# Patient Record
Sex: Female | Born: 1985 | Race: White | Hispanic: No | Marital: Single | Smoking: Current every day smoker
Health system: Southern US, Community
[De-identification: ages and names within clinical notes are randomized; demographics above are authoritative.]

---
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
## 2007-04-25 ENCOUNTER — Emergency Department (HOSPITAL_COMMUNITY): Admission: EM | Admit: 2007-04-25 | Discharge: 2007-04-25 | Payer: Self-pay | Admitting: Emergency Medicine

## 2007-11-22 ENCOUNTER — Other Ambulatory Visit: Admission: RE | Admit: 2007-11-22 | Discharge: 2007-11-22 | Payer: Self-pay | Admitting: Family Medicine

## 2007-11-22 ENCOUNTER — Encounter: Admission: RE | Admit: 2007-11-22 | Discharge: 2007-11-22 | Payer: Self-pay | Admitting: Emergency Medicine

## 2013-11-09 ENCOUNTER — Encounter (HOSPITAL_COMMUNITY): Payer: Self-pay | Admitting: Emergency Medicine

## 2013-11-09 ENCOUNTER — Emergency Department (INDEPENDENT_AMBULATORY_CARE_PROVIDER_SITE_OTHER)
Admission: EM | Admit: 2013-11-09 | Discharge: 2013-11-09 | Disposition: A | Discharge status: Home / Self Care | Payer: Commercial Managed Care - PPO | Source: Home / Self Care | Attending: Emergency Medicine | Admitting: Emergency Medicine

## 2013-11-09 ENCOUNTER — Emergency Department (INDEPENDENT_AMBULATORY_CARE_PROVIDER_SITE_OTHER): Payer: Commercial Managed Care - PPO

## 2013-11-09 DIAGNOSIS — J209 Acute bronchitis, unspecified: Secondary | ICD-10-CM

## 2013-11-09 MED ORDER — PREDNISONE 20 MG PO TABS: 20.0000 mg | ORAL_TABLET | Freq: Two times a day (BID) | ORAL | Status: AC

## 2013-11-09 MED ORDER — METHYLPREDNISOLONE ACETATE 80 MG/ML IJ SUSP
INTRAMUSCULAR | Status: AC
  Filled 2013-11-09: qty 1

## 2013-11-09 MED ORDER — AZITHROMYCIN 250 MG PO TABS: ORAL_TABLET | ORAL | Status: AC

## 2013-11-09 MED ORDER — HYDROCOD POLST-CHLORPHEN POLST 10-8 MG/5ML PO LQCR: 5.0000 mL | Freq: Two times a day (BID) | ORAL | Status: AC | PRN

## 2013-11-09 MED ORDER — IPRATROPIUM-ALBUTEROL 0.5-2.5 (3) MG/3ML IN SOLN
3.0000 mL | RESPIRATORY_TRACT | Status: DC
  Administered 2013-11-09: 3 mL via RESPIRATORY_TRACT

## 2013-11-09 MED ORDER — METHYLPREDNISOLONE ACETATE 80 MG/ML IJ SUSP
80.0000 mg | Freq: Once | INTRAMUSCULAR | Status: AC
  Administered 2013-11-09: 80 mg via INTRAMUSCULAR

## 2013-11-09 MED ORDER — IPRATROPIUM-ALBUTEROL 0.5-2.5 (3) MG/3ML IN SOLN
RESPIRATORY_TRACT | Status: AC
  Filled 2013-11-09: qty 3

## 2013-11-09 MED ORDER — ALBUTEROL SULFATE HFA 108 (90 BASE) MCG/ACT IN AERS: 1.0000 | INHALATION_SPRAY | Freq: Four times a day (QID) | RESPIRATORY_TRACT | Status: AC | PRN

## 2013-11-09 NOTE — ED Provider Notes (Signed)
Chief Complaint   Chief Complaint  Patient presents with  . Cough    History of Present Illness   Donna Rubio is a 28 year old female who's had a two-day history of nasal congestion with yellow drainage, headache, sinus pressure, cough productive yellow sputum, wheezing, chest tightness, chest pain, subjective fever, chills, sweats, aches, and sore throat. She's had no known sick exposures. She smokes a half a pack of cigarettes per day.  Review of Systems   Other than as noted above, the patient denies any of the following symptoms: Systemic:  No fevers, chills, sweats, or myalgias. Eye:  No redness or discharge. ENT:  No ear pain, headache, nasal congestion, drainage, sinus pressure, or sore throat. Neck:  No neck pain, stiffness, or swollen glands. Lungs:  No cough, sputum production, hemoptysis, wheezing, chest tightness, shortness of breath or chest pain. GI:  No abdominal pain, nausea, vomiting or diarrhea.  PMFSH   Past medical history, family history, social history, meds, and allergies were reviewed.   Physical exam   Vital signs:  BP 137/78  Pulse 68  Temp(Src) 97.7 F (36.5 C) (Oral)  Resp 18  SpO2 98%  LMP 11/06/2013 General:  Alert and oriented.  In no distress.  Skin warm and dry. Eye:  No conjunctival injection or drainage. Lids were normal. ENT:  TMs and canals were normal, without erythema or inflammation.  Nasal mucosa was clear and uncongested, without drainage.  Mucous membranes were moist.  Pharynx was clear with no exudate or drainage.  There were no oral ulcerations or lesions. Neck:  Supple, no adenopathy, tenderness or mass. Lungs:  No respiratory distress.  She has bilateral expiratory wheezes, no rales or rhonchi, good air movement.  Heart:  Regular rhythm, without gallops, murmers or rubs. Skin:  Clear, warm, and dry, without rash or lesions.  Radiology   Dg Chest 2 View  11/09/2013   CLINICAL DATA:  Fever chest pain and cough. ,  history of tobacco use and bronchitis  EXAM: CHEST  2 VIEW  COMPARISON:  DG CHEST 2 VIEW dated 11/22/2007  FINDINGS: The lungs are mildly hyperinflated. There is no focal infiltrate. The cardiopericardial silhouette is normal in size. The pulmonary vascularity is not engorged. The mediastinum is normal in width. There is no pleural effusion. The observed portions of the bony thorax appear normal.  IMPRESSION: There is mild hyperinflation which may reflect underlying COPD or reactive airway disease. There is no focal pneumonia. One cannot exclude acute bronchitis in the appropriate clinical setting.   Electronically Signed   By: Audiel Scheiber  [place]   On: 11/09/2013 13:28   [facility]   She was given Depo-Medrol 80 mg IM a DuoNeb breathing treatment. Thereafter her lungs were clear and wheeze free. She did feel better.  Assessment     The encounter diagnosis was Acute bronchitis.  She was strongly encouraged to quit smoking.  Plan    1.  Meds:  The following meds were prescribed:   Discharge Medication List as of 11/09/2013  1:52 PM    START taking these medications   Details  albuterol (PROVENTIL HFA;VENTOLIN HFA) 108 (90 BASE) MCG/ACT inhaler Inhale 1-2 puffs into the lungs every 6 (six) hours as needed for wheezing or shortness of breath., Starting 11/09/2013, Until Discontinued, Normal    azithromycin (ZITHROMAX Z-PAK) 250 MG tablet Take as directed., Normal    chlorpheniramine-HYDROcodone (TUSSIONEX) 10-8 MG/5ML LQCR Take 5 mLs by mouth every 12 (twelve) hours as needed  for cough., Starting 11/09/2013, Until Discontinued, Normal    predniSONE (DELTASONE) 20 MG tablet Take 1 tablet (20 mg total) by mouth 2 (two) times daily., Starting 11/09/2013, Until Discontinued, Normal        2.  Patient Education/Counseling:  The patient was given appropriate handouts, self care instructions, and instructed in symptomatic relief.  Instructed to get extra fluids, rest, and use a cool mist  vaporizer.    3.  Follow up:  The patient was told to follow up here if no better in 3 to 4 days, or sooner if becoming worse in any way, and given some red flag symptoms such as increasing fever, difficulty breathing, chest pain, or persistent vomiting which would prompt immediate return.  Follow up here as needed.      Reuben Likesavid C Amiel Sharrow, MD 11/09/13 2135

## 2013-11-09 NOTE — ED Notes (Signed)
Pt  Reports    Cough  /  congested  With       Wheezing          And  Tightness          Body  Aches  That  Started         Yesterday

## 2013-11-09 NOTE — Discharge Instructions (Signed)
Most upper respiratory infections are caused by viruses and do not require antibiotics.  We try to save the antibiotics for when we really need them to prevent bacteria from developing resistance to them.  Here are a few hints about things that can be done at home to help get over an upper respiratory infection quicker:  Get extra sleep and extra fluids.  Get 7 to 9 hours of sleep per night and 6 to 8 glasses of water a day.  Getting extra sleep keeps the immune system from getting run down.  Most people with an upper respiratory infection are a little dehydrated.  The extra fluids also keep the secretions liquified and easier to deal with.  Also, get extra vitamin C.  4000 mg per day is the recommended dose. For the aches, headache, and fever, acetaminophen or ibuprofen are helpful.  These can be alternated every 4 hours.  People with liver disease should avoid large amounts of acetaminophen, and people with ulcer disease, gastroesophageal reflux, gastritis, congestive heart failure, chronic kidney disease, coronary artery disease and the elderly should avoid ibuprofen. For nasal congestion try Mucinex-D, or if you're having lots of sneezing or clear nasal drainage use Zyrtec-D. People with high blood pressure can take these if their blood pressure is controlled, if not, it's best to avoid the forms with a "D" (decongestants).  You can use the plain Mucinex, Allegra, Claritin, or Zyrtec even if your blood pressure is not controlled.   A Saline nasal spray such as Ocean Spray can also help.  You can add a decongestant sprays such as Afrin, but you should not use the decongestant sprays for more than 3 or 4 days since they can be habituating.  Breathe Rite nasal strips can also offer a non-drug alternative treatment to nasal congestion, especially at night. For people with symptoms of sinusitis, sleeping with your head elevated can be helpful.  For sinus pain, moist, hot compresses to the face may provide some  relief.  Many people find that inhaling steam as in a shower or from a pot of steaming water can help. For any viral infection, zinc containing lozenges such as Cold-Eze or Zicam are helpful.  Zinc helps to fight viral infection.  Hot salt water gargles (8 oz of hot water, 1/2 tsp of table salt, and a pinch of baking soda) can give relief as well as hot beverages such as hot tea.  Sucrets extra strength lozenges will help the sore throat.  For the cough, take Delsym 2 tsp every 12 hours.  It has also been found recently that Aleve can help control a cough.  The dose is 1 to 2 tablets twice daily with food.  This can be combined with Delsym. (Note, if you are taking ibuprofen, you should not take Aleve as well--take one or the other.) A cool mist vaporizer will help keep your mucous membranes from drying out.   It's important when you have an upper respiratory infection not to pass the infection to others.  This involves being very careful about the following:  Frequent hand washing or use of hand sanitizer, especially after coughing, sneezing, blowing your nose or touching your face, nose or eyes. Do not shake hands or touch anyone and try to avoid touching surfaces that other people use such as doorknobs, shopping carts, telephones and computer keyboards. Use tissues and dispose of them properly in a garbage can or ziplock bag. Cough into your sleeve. Do not let others eat or  drink after you.  It's also important to recognize the signs of serious illness and get evaluated if they occur: Any respiratory infection that lasts more than 7 to 10 days.  Yellow nasal drainage and sputum are not reliable indicators of a bacterial infection, but if they last for more than 1 week, see your doctor. Fever and sore throat can indicate strep. Fever and cough can indicate influenza or pneumonia. Any kind of severe symptom such as difficulty breathing, intractable vomiting, or severe pain should prompt you to see  a doctor as soon as possible.   Your body's immune system is really the thing that will get rid of this infection.  Your immune system is comprised of 2 types of specialized cells called T cells and B cells.  T cells coordinate the array of cells in your body that engulf invading bacteria or viruses while B cells orchestrate the production of antibodies that neutralize infection.  Anything we do or any medications we give you, will just strengthen your immune system or help it clear up the infection quicker.  Here are a few helpful hints to improve your immune system to help overcome this illness or to prevent future infections:  A few vitamins can improve the health of your immune system.  That's why your diet should include plenty of fruits, vegetables, fish, nuts, and whole grains.  Vitamin A and bet-carotene can increase the cells that fight infections (T cells and B cells).  Vitamin A is abundant in dark greens and orange vegetables such as spinach, greens, sweet potatoes, and carrots.  Vitamin B6 contributes to the maturation of white blood cells, the cells that fight disease.  Foods with vitamin B6 include cold cereal and bananas.  Vitamin C is credited with preventing colds because it increases white blood cells and also prevents cellular damage.  Citrus fruits, peaches and green and red bell peppers are all hight in vitamin C.  Vitamin E is an anti-oxidant that encourages the production of natural killer cells which reject foreign invaders and B cells that produce antibodies.  Foods high in vitamin E include wheat germ, nuts and seeds.  Foods high in omega-3 fatty acids found in foods like salmon, tuna and mackerel boost your immune system and help cells to engulf and absorb germs.  Probiotics are good bacteria that increase your T cells.  These can be found in yogurt and are available in supplements such as Culturelle or Align.  Moderate exercise increases the strength of your immune  system and your ability to recover from illness.  I suggest 3 to 5 moderate intensity 30 minute workouts per week.    Sleep is another component of maintaining a strong immune system.  It enables your body to recuperate from the day's activities, stress and work.  My recommendation is to get between 7 and 9 hours of sleep per night.  If you smoke, try to quit completely or at least cut down.  Drink alcohol only in moderation if at all.  No more than 2 drinks daily for men or 1 for women.  Get a flu vaccine early in the fall or if you have not gotten one yet, once this illness has run its course.  If you are over 65, a smoker, or an asthmatic, get a pneumococcal vaccine.  My final recommendation is to maintain a healthy weight.  Excess weight can impair the immune system by interfering with the way the immune system deals with invading viruses or  bacteria.   Bronchitis Bronchitis is inflammation of the airways that extend from the windpipe into the lungs (bronchi). The inflammation often causes mucus to develop, which leads to a cough. If the inflammation becomes severe, it may cause shortness of breath. CAUSES  Bronchitis may be caused by:   Viral infections.   Bacteria.   Cigarette smoke.   Allergens, pollutants, and other irritants.  SIGNS AND SYMPTOMS  The most common symptom of bronchitis is a frequent cough that produces mucus. Other symptoms include:  Fever.   Body aches.   Chest congestion.   Chills.   Shortness of breath.   Sore throat.  DIAGNOSIS  Bronchitis is usually diagnosed through a medical history and physical exam. Tests, such as chest X-rays, are sometimes done to rule out other conditions.  TREATMENT  You may need to avoid contact with whatever caused the problem (smoking, for example). Medicines are sometimes needed. These may include:  Antibiotics. These may be prescribed if the condition is caused by bacteria.  Cough suppressants. These may  be prescribed for relief of cough symptoms.   Inhaled medicines. These may be prescribed to help open your airways and make it easier for you to breathe.   Steroid medicines. These may be prescribed for those with recurrent (chronic) bronchitis. HOME CARE INSTRUCTIONS  Get plenty of rest.   Drink enough fluids to keep your urine clear or pale yellow (unless you have a medical condition that requires fluid restriction). Increasing fluids may help thin your secretions and will prevent dehydration.   Only take over-the-counter or prescription medicines as directed by your health care provider.  Only take antibiotics as directed. Make sure you finish them even if you start to feel better.  Avoid secondhand smoke, irritating chemicals, and strong fumes. These will make bronchitis worse. If you are a smoker, quit smoking. Consider using nicotine gum or skin patches to help control withdrawal symptoms. Quitting smoking will help your lungs heal faster.   Put a cool-mist humidifier in your bedroom at night to moisten the air. This may help loosen mucus. Change the water in the humidifier daily. You can also run the hot water in your shower and sit in the bathroom with the door closed for 5 10 minutes.   Follow up with your health care provider as directed.   Wash your hands frequently to avoid catching bronchitis again or spreading an infection to others.  SEEK MEDICAL CARE IF: Your symptoms do not improve after 1 week of treatment.  SEEK IMMEDIATE MEDICAL CARE IF:  Your fever increases.  You have chills.   You have chest pain.   You have worsening shortness of breath.   You have bloody sputum.  You faint.  You have lightheadedness.  You have a severe headache.   You vomit repeatedly. MAKE SURE YOU:   Understand these instructions.  Will watch your condition.  Will get help right away if you are not doing well or get worse. Document Released: 08/31/2005  Document Revised: 06/21/2013 Document Reviewed: 04/25/2013 [facility] Patient Information 2014 [employer].  How to Quit Smoking  According to the U.S. Surgeon General, about 440,000 people in the [place] alone die from complications related to tobacco use.  More deaths occur due to cigarette smoking than illegal drug use, AIDS, car accidents, alcohol-related deaths, suicide and homicide combined.  Smoking accounts for about 30% of all cancer related deaths, including more than 80% of lung cancer deaths. Smoking has also been linked as  the cause of many other diseases like heart disease, bronchitis, emphysema, stroke, and complications of pneumonia as well as causing an increased risk of miscarriage, premature births, stillbirth, infant death, and low birth weight in infants.  For this reason, the U.S. Surgeon General recommends:  "Smoking cessation (stopping smoking) represents the single most important step that smokers can take to enhance the length and quality of their lives."  Why is it so hard to Quit?  Tobacco products contain Nicotine which is highly addictive - probably as addictive as heroin or cocaine.  Over time, your body becomes both physically and psychologically dependent on it. Finally, attempts to quit smoking are complicated by withdrawal reactions like depression, irritability, trouble sleeping, trouble concentrating, restlessness, headaches, weight gain, and excessive fatigue as well as a lack of support.  These symptoms can last from a few days to several weeks.  Why should you Quit?  Live longer and healthier  Can improves the health of your housemates (children, spouse)  Increases your energy and breathing ability  Lowers risk of heart attack, stroke and cancer  Saves money - For example, if you smoke a pack of cigarettes a day and each pack costs about $3.00, then you will save about $1,100.00 per year, about $5,500. in 5 years and $11,000.00 in 10  years.  What you can do: 1. Talk to your health care provider - There are many smoking cessations aids available, both prescription or over-the-counter.  Check with your doctor and pharmacist before taking any of these products to see which one is best for you.  Develop a plan with your healthcare provider which may include nicotine replacement, prescription medication and/or counseling.  2. Get Started - [place] a start date on your calendar.  Remove cigarettes and ashtrays from your home, car, and office.  Dont be around other smokers.  Stop smokingnot even a puff!  3. Support - Talk to family, friends, and co-workers about your plan to stop smoking.  Ask them not to smoke around you.  4. Coping Strategies  The four As to help during tough times:  a. Avoid - Avoid other smokers or places where smoking is commonplace.  Avoid alcoholic beverages as these may increase your desire to smoke b. Alter - Change your routine.  Drink water/juices instead of alcohol or coffee.  Take a walk or visit with someone during your coffee break.  Change your route to work. c. Alternatives - Substitute raw vegetables like carrot sticks or celery, sugarless candy or gum for the habit of having a cigarette. d. Activities - Start an exercise program (talk to your doctor prior to beginning any exercise program). Try out a new hands on hobby to distract you from smoking and to keep your hands busy like woodworking or needlepoint.   Additional tips for specific withdrawal symptoms:   Cravings for tobacco:  -- Distract yourself       -- Deep-breathing exercises       -- Remember that cravings are brief    Irritability:   -- Take a few slow, deep breaths      -- Soak in a hot bath    Insomnia:   -- Take a walk several hours before bedtime      -- Avoid caffeinated beverages after noon      -- Read a book      -- Take a warm bath      -- Banana or warm milk    Increased appetite:  --  Drink water or  low-calorie drinks      -- Make a survival Kit: Include straws, cinnamon        sticks,       -- coffee stirrers, licorice, toothpicks, gum, or fresh         vegetables    Inability to concentrate: -- Take a brisk walk      -- Lighten your schedule for a couple of days      -- Take more breaks   Fatigue:   -- Get a good nights sleep      -- Take a nap      -- Dont overdo it for 2-4 weeks    Constipation, gas,  stomach pain:   -- Drink plenty of fluids      -- Increase fiber: fruit, raw vegetables, whole grain        cereals      -- Talk to your doctor about diet changes    5. Dealing with Relapses - Most relapses occur within the first 2-3 months.  This is common so dont be discouraged.  Some people may take several attempts before they can quit smoking completely.  6. Reward yourself - Set-up a rewards program for every milestone, like 1st month after quitting, 3rd month after quitting, and 6th month after quitting to keep you motivated.  What your doctor can do: Perform a physical exam and order diagnostic tests like laboratory blood work and a chest X-ray.  This will help to identify health related conditions that might benefit from smoking cessation. Review your health history to make sure there are no contraindications with specific smoking cessation aids like allergies to medications or ingredients in these medications or conflicts with your current medications. Prescribe smoking cessation aids such as: Over-the-counter aid:  Nicotine gum, Nicotine patch Prescription aids:  Nicotine spray, Nicotine inhaler, or Bupropion SR (non-nicotine). Offer or recommend individual or group counseling to support you during the initial quitting and maintenance phase of your smoking cessation program. Offer or recommend other alternative treatments like hypnosis or acupuncture.  What you can expect: Benefits from quitting smoking:  Improved Physical Appearance - Minimizes or stops  premature wrinkling of skin, bad breath, stained teeth, gum disease, clothes/hair smoke odors, and yellow fingernails  Improved Daily Activities - Food tastes better, sense of smell improves, and decreases shortness of breath during ordinary activities like climbing stairs, walking, and performing light housework  Decreased Financial Cost - From both no longer purchasing cigarettes and the health care cost for medical treatment of conditions caused by smoking.  Health of Others - Decreases risk of exposing others to the effects of second hand smoke.  Sets an example for youth. Benefits of Quitting according to research from the U.S. Surgeon General:  20 minutes after:  Blood pressure lowers & Body temperature normalizes  8 hours after: Carbon monoxide levels begins to normalize   24 hours after: Heart attack risk decreases  2 weeks to 3 months after:  Blood flow improves & lung function increases   1 to 9 months after:  Coughing, sinus congestion, fatigue, shortness of breath decrease   1 year after: Risk of developing coronary heart disease is half that of a smoker  5 years after: Risk of a stroke decreases to that of a nonsmoker  10 years after: Risk of death due to lung cancer death is halved.  Risk of oral, throat, esophagus, bladder, kidney, and pancreatic cancer decreases.  15 years after: Risk  of developing coronary heart disease is half that of a nonsmoker      References:  Here are references that can provide additional information and support:  [place] (800) ACS-2345       (800) 6678143569 or (800) 718-776-7604 www.cancer.org       www.amhrt.Pharmacist, community [employer] 917 185 2620 or 607-802-5285  (800928-037-0622 or 224-218-1930 www.lungusa.org       www.medicalacupuncture.[place]     Office on [place] for Disease Control and Prevention (800) 4-CANCER or (800) Y5278638  830-638-9318 www.cancer.gov       [employer].ch  Nicotine Anonymous      Smokefree.gov 819 154 0724) TRY-NICA 208-290-0506)   (503)703-2059) 44U-QUIT (365)391-6767) www.nicotine-anonymous.org   www.smokefree.gov  Contact your doctor or pharmacist if you have specific questions about starting a smoking cessation program.

## 2014-06-28 IMAGING — CR DG CHEST 2V
2 series · 2 of 2 positions shown · non-contrast
Comparison: DG CHEST 2 VIEW dated 11/22/2007

CLINICAL DATA: Fever chest pain and cough. , history of tobacco use
and bronchitis

EXAM:
CHEST  2 VIEW

[view not recorded (1 of 2)]
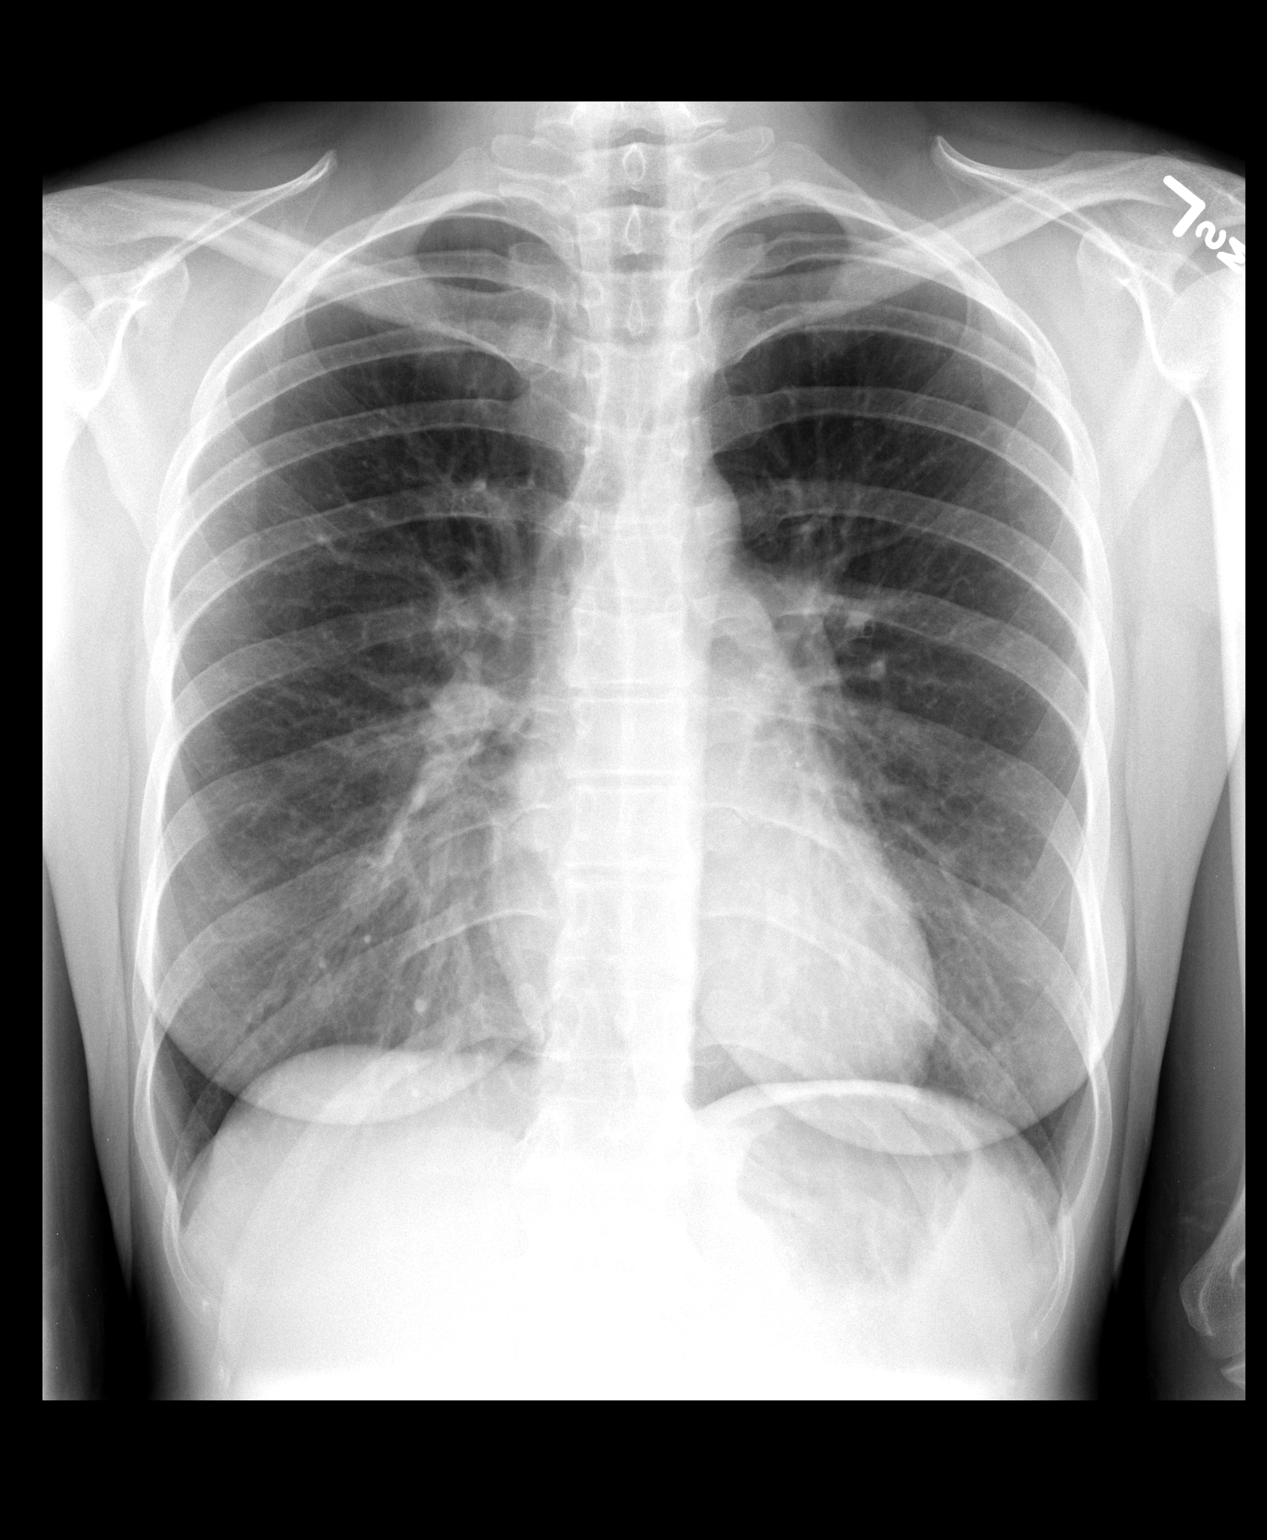

[view not recorded (2 of 2)]
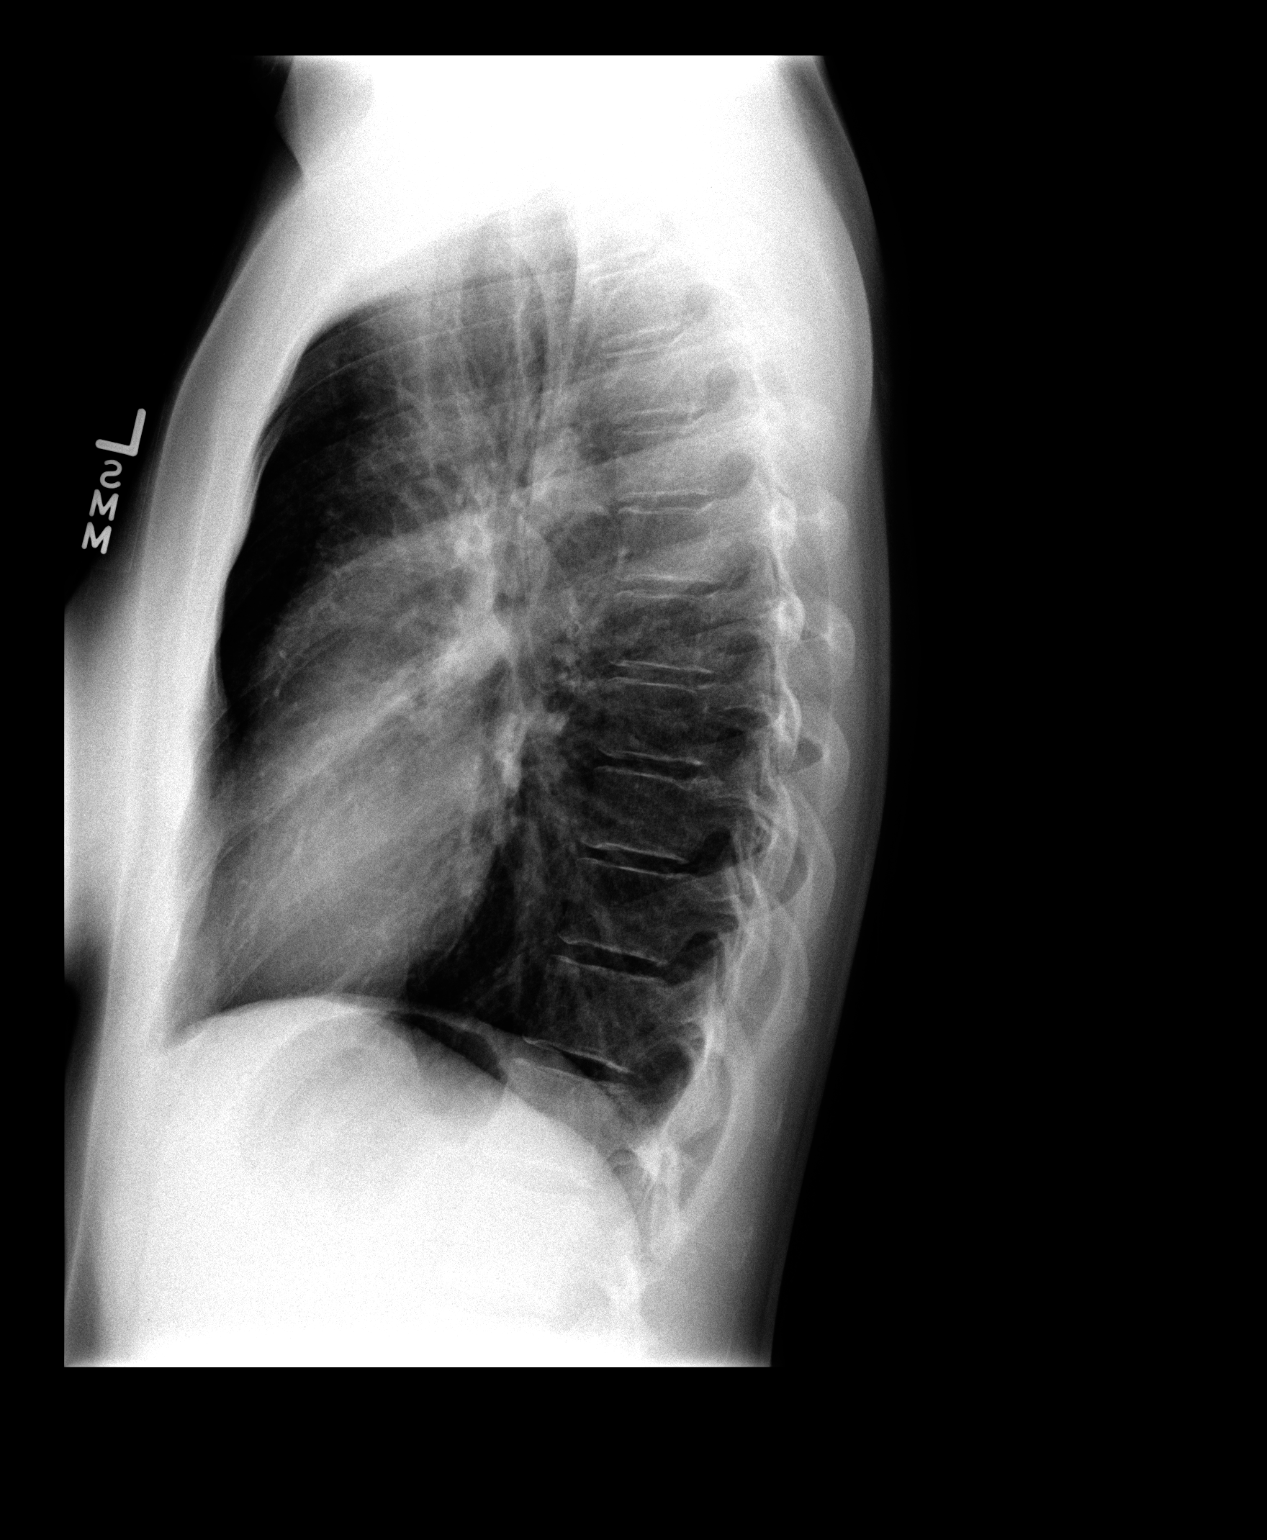

[2 of 2 positions shown; findings below may reference images not displayed]

FINDINGS: The lungs are mildly hyperinflated. There is no focal infiltrate.
The cardiopericardial silhouette is normal in size. The pulmonary
vascularity is not engorged. The mediastinum is normal in width.
There is no pleural effusion. The observed portions of the bony
thorax appear normal.
IMPRESSION: There is mild hyperinflation which may reflect underlying COPD or
reactive airway disease. There is no focal pneumonia. One cannot
exclude acute bronchitis in the appropriate clinical setting.
